# Patient Record
Sex: Male | Born: 1990 | Race: Black or African American | Hispanic: No | Marital: Single | Smoking: Never smoker
Health system: Southern US, Community
[De-identification: ages and names within clinical notes are randomized; demographics above are authoritative.]

## PROBLEM LIST (undated history)

## (undated) HISTORY — PX: HERNIA REPAIR: SHX51

## (undated) HISTORY — PX: ANKLE CLOSED REDUCTION: SHX880

---
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
## 2010-01-31 ENCOUNTER — Emergency Department (HOSPITAL_COMMUNITY): Admission: EM | Admit: 2010-01-31 | Discharge: 2010-01-31 | Payer: Self-pay | Admitting: Family Medicine

## 2010-09-15 ENCOUNTER — Encounter: Admission: RE | Admit: 2010-09-15 | Discharge: 2010-09-15 | Payer: Self-pay | Admitting: Orthopedic Surgery

## 2010-10-18 ENCOUNTER — Ambulatory Visit
Admission: RE | Admit: 2010-10-18 | Discharge: 2010-10-18 | Payer: Self-pay | Source: Home / Self Care | Attending: General Surgery | Admitting: General Surgery

## 2010-11-05 ENCOUNTER — Encounter: Payer: Self-pay | Admitting: Orthopedic Surgery

## 2010-12-26 LAB — POCT I-STAT 4, (NA,K, GLUC, HGB,HCT)
Hemoglobin: 15.6 g/dL (ref 13.0–17.0)
Sodium: 140 mEq/L (ref 135–145)

## 2011-09-03 ENCOUNTER — Emergency Department (HOSPITAL_COMMUNITY)
Admission: EM | Admit: 2011-09-03 | Discharge: 2011-09-03 | Disposition: A | Discharge status: Home / Self Care | Payer: Self-pay | Attending: Emergency Medicine | Admitting: Emergency Medicine

## 2011-09-03 ENCOUNTER — Encounter: Payer: Self-pay | Admitting: *Deleted

## 2011-09-03 ENCOUNTER — Emergency Department (HOSPITAL_COMMUNITY): Payer: Self-pay

## 2011-09-03 DIAGNOSIS — S0003XA Contusion of scalp, initial encounter: Secondary | ICD-10-CM | POA: Insufficient documentation

## 2011-09-03 DIAGNOSIS — R221 Localized swelling, mass and lump, neck: Secondary | ICD-10-CM | POA: Insufficient documentation

## 2011-09-03 DIAGNOSIS — R220 Localized swelling, mass and lump, head: Secondary | ICD-10-CM | POA: Insufficient documentation

## 2011-09-03 DIAGNOSIS — S0083XA Contusion of other part of head, initial encounter: Secondary | ICD-10-CM

## 2011-09-03 DIAGNOSIS — H5789 Other specified disorders of eye and adnexa: Secondary | ICD-10-CM | POA: Insufficient documentation

## 2011-09-03 MED ORDER — DIAZEPAM 5 MG PO TABS: 5.0000 mg | ORAL_TABLET | Freq: Four times a day (QID) | ORAL | Status: AC | PRN

## 2011-09-03 MED ORDER — IBUPROFEN 800 MG PO TABS
800.0000 mg | ORAL_TABLET | Freq: Once | ORAL | Status: AC
  Administered 2011-09-03: 800 mg via ORAL
  Filled 2011-09-03: qty 1

## 2011-09-03 MED ORDER — OXYCODONE-ACETAMINOPHEN 5-325 MG PO TABS
2.0000 | ORAL_TABLET | Freq: Once | ORAL | Status: AC
  Administered 2011-09-03: 2 via ORAL
  Filled 2011-09-03: qty 2

## 2011-09-03 MED ORDER — DIAZEPAM 5 MG PO TABS
5.0000 mg | ORAL_TABLET | Freq: Once | ORAL | Status: AC
  Administered 2011-09-03: 5 mg via ORAL
  Filled 2011-09-03: qty 1

## 2011-09-03 MED ORDER — HYDROCODONE-ACETAMINOPHEN 5-325 MG PO TABS: ORAL_TABLET | ORAL | Status: AC

## 2011-09-03 MED ORDER — IBUPROFEN 800 MG PO TABS: 800.0000 mg | ORAL_TABLET | Freq: Three times a day (TID) | ORAL | Status: AC | PRN

## 2011-09-03 MED ORDER — BACITRACIN ZINC 500 UNIT/GM EX OINT
TOPICAL_OINTMENT | CUTANEOUS | Status: AC
  Administered 2011-09-03: 08:00:00 via TOPICAL
  Filled 2011-09-03: qty 0.9
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
## 2011-09-03 NOTE — ED Provider Notes (Signed)
Medical screening examination/treatment/procedure(s) were performed by non-physician practitioner and as supervising physician I was immediately available for consultation/collaboration.  Jasmine Awe, MD 09/03/11 [phone]
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
## 2012-06-14 ENCOUNTER — Other Ambulatory Visit: Payer: Self-pay | Admitting: Orthopedic Surgery

## 2012-06-14 DIAGNOSIS — S76319A Strain of muscle, fascia and tendon of the posterior muscle group at thigh level, unspecified thigh, initial encounter: Secondary | ICD-10-CM

## 2012-06-15 ENCOUNTER — Ambulatory Visit
Admission: RE | Admit: 2012-06-15 | Discharge: 2012-06-15 | Disposition: A | Discharge status: Home / Self Care | Payer: BC Managed Care – PPO | Source: Ambulatory Visit | Attending: Orthopedic Surgery | Admitting: Orthopedic Surgery

## 2012-06-15 DIAGNOSIS — S76319A Strain of muscle, fascia and tendon of the posterior muscle group at thigh level, unspecified thigh, initial encounter: Secondary | ICD-10-CM

## 2014-07-16 ENCOUNTER — Encounter (HOSPITAL_COMMUNITY): Payer: Self-pay | Admitting: Emergency Medicine

## 2014-07-16 ENCOUNTER — Emergency Department (HOSPITAL_COMMUNITY)
Admission: EM | Admit: 2014-07-16 | Discharge: 2014-07-16 | Disposition: A | Discharge status: Home / Self Care | Payer: BC Managed Care – PPO | Attending: Emergency Medicine | Admitting: Emergency Medicine

## 2014-07-16 DIAGNOSIS — S3992XA Unspecified injury of lower back, initial encounter: Secondary | ICD-10-CM | POA: Diagnosis not present

## 2014-07-16 DIAGNOSIS — S199XXA Unspecified injury of neck, initial encounter: Secondary | ICD-10-CM | POA: Diagnosis present

## 2014-07-16 DIAGNOSIS — Z791 Long term (current) use of non-steroidal anti-inflammatories (NSAID): Secondary | ICD-10-CM | POA: Diagnosis not present

## 2014-07-16 DIAGNOSIS — S6992XA Unspecified injury of left wrist, hand and finger(s), initial encounter: Secondary | ICD-10-CM | POA: Insufficient documentation

## 2014-07-16 DIAGNOSIS — Y92410 Unspecified street and highway as the place of occurrence of the external cause: Secondary | ICD-10-CM | POA: Diagnosis not present

## 2014-07-16 DIAGNOSIS — M7918 Myalgia, other site: Secondary | ICD-10-CM

## 2014-07-16 DIAGNOSIS — Z79899 Other long term (current) drug therapy: Secondary | ICD-10-CM | POA: Insufficient documentation

## 2014-07-16 DIAGNOSIS — Y9389 Activity, other specified: Secondary | ICD-10-CM | POA: Diagnosis not present

## 2014-07-16 MED ORDER — CYCLOBENZAPRINE HCL 10 MG PO TABS: 10.0000 mg | ORAL_TABLET | Freq: Two times a day (BID) | ORAL | Status: AC | PRN

## 2014-07-16 MED ORDER — IBUPROFEN 800 MG PO TABS: 800.0000 mg | ORAL_TABLET | Freq: Three times a day (TID) | ORAL | Status: DC
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
## 2014-07-16 NOTE — ED Provider Notes (Signed)
Medical screening examination/treatment/procedure(s) were performed by non-physician practitioner and as supervising physician I was immediately available for consultation/collaboration.  Demetrie Borge, MD 07/16/14 1528 
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
## 2015-09-28 ENCOUNTER — Emergency Department (HOSPITAL_COMMUNITY): Payer: BLUE CROSS/BLUE SHIELD

## 2015-09-28 ENCOUNTER — Encounter (HOSPITAL_COMMUNITY): Payer: Self-pay | Admitting: Emergency Medicine

## 2015-09-28 ENCOUNTER — Emergency Department (HOSPITAL_COMMUNITY)
Admission: EM | Admit: 2015-09-28 | Discharge: 2015-09-29 | Disposition: A | Discharge status: Home / Self Care | Payer: BLUE CROSS/BLUE SHIELD | Attending: Emergency Medicine | Admitting: Emergency Medicine

## 2015-09-28 DIAGNOSIS — Y998 Other external cause status: Secondary | ICD-10-CM | POA: Insufficient documentation

## 2015-09-28 DIAGNOSIS — S99911A Unspecified injury of right ankle, initial encounter: Secondary | ICD-10-CM | POA: Diagnosis present

## 2015-09-28 DIAGNOSIS — W500XXA Accidental hit or strike by another person, initial encounter: Secondary | ICD-10-CM | POA: Insufficient documentation

## 2015-09-28 DIAGNOSIS — M25571 Pain in right ankle and joints of right foot: Secondary | ICD-10-CM

## 2015-09-28 DIAGNOSIS — Y9289 Other specified places as the place of occurrence of the external cause: Secondary | ICD-10-CM | POA: Diagnosis not present

## 2015-09-28 DIAGNOSIS — Z9889 Other specified postprocedural states: Secondary | ICD-10-CM | POA: Diagnosis not present

## 2015-09-28 DIAGNOSIS — Y9361 Activity, american tackle football: Secondary | ICD-10-CM | POA: Diagnosis not present

## 2015-09-28 MED ORDER — IBUPROFEN 800 MG PO TABS
800.0000 mg | ORAL_TABLET | Freq: Once | ORAL | Status: AC
  Administered 2015-09-28: 800 mg via ORAL
  Filled 2015-09-28: qty 1

## 2015-09-28 NOTE — ED Provider Notes (Signed)
CSN: [id number]     Arrival dat Patient is a is no ankle brace for stability. He has crutches already fore & time 09/28/15  2305 History  By signing my name below, I, Emmanuella Mensah, attest that this documentation has been prepared under the direction and in the presence of [employer], PA-C. Electronically Signed: Angelene Giovanni, ED Scribe. 09/28/2015. 11:32 PM.     Chief Complaint  Patient presents with  . Ankle Injury   The history is provided by the patient. No language interpreter was used.   HPI Comments: Willie Eaton is a 24 y.o. male who presents to the Emergency Department complaining of gradually worsening 8/10 right ankle pain s/p ankle injury that occurred today. He reports associated mild swelling to the left lateral aspect of his right ankle. He denies any fever. He explains that he was playing football when someone fell on his right ankle. No alleviating factors noted. He reports that he had a trimalleolar fracture injury in college and needed to have surgery.   No past medical history on file. Past Surgical History  Procedure Laterality Date  . Hernia repair    . Ankle closed reduction     No family history on file. Social History  Substance Use Topics  . Smoking status: Never Smoker   . Smokeless tobacco: None  . Alcohol Use: Yes     Comment: Q 2 weeks    Review of Systems  Constitutional: Negative for fever.  Musculoskeletal: Positive for joint swelling (mild) and arthralgias.  All other systems reviewed and are negative.   Allergies  Review of patient's allergies indicates no known allergies.  Home Medications   Prior to Admission medications   Medication Sig Start Date End Date Taking? Authorizing Provider  cyclobenzaprine (FLEXERIL) 10 MG tablet Take 1 tablet (10 mg total) by mouth 2 (two) times daily as needed for muscle spasms. Patient not taking: Reported on 09/28/2015 07/16/14   Kathrynn Speed, PA-C  ibuprofen (ADVIL,MOTRIN) 800 MG tablet  Take 1 tablet (800 mg total) by mouth 3 (three) times daily. 09/29/15   Antony Madura, PA-C   BP 142/61 mmHg  Pulse 61  Temp(Src) 98.4 F (36.9 C) (Oral)  Resp 20  SpO2 100%   Physical Exam  Constitutional: He is oriented to person, place, and time. He appears well-developed and well-nourished. No distress.  Nontoxic/nonseptic appearing  HENT:  Head: Normocephalic and atraumatic.  Eyes: Conjunctivae and EOM are normal. No scleral icterus.  Neck: Normal range of motion.  Cardiovascular: Normal rate, regular rhythm and intact distal pulses.   DP and PT pulses 2+ in the RLE  Pulmonary/Chest: Effort normal. No respiratory distress.  Respirations even and unlabored  Musculoskeletal: Normal range of motion.       Right ankle: He exhibits swelling (mild, medially). He exhibits normal range of motion, no deformity and normal pulse. Tenderness. Lateral malleolus and medial malleolus tenderness found. Achilles tendon normal.       Right lower leg: Normal.       Right foot: Normal.       Feet:  Neurological: He is alert and oriented to person, place, and time. He exhibits normal muscle tone. Coordination normal.  Sensation to light touch intact. Patient able to wiggle all toes.  Skin: Skin is warm and dry. No rash noted. He is not diaphoretic. No erythema. No pallor.  Psychiatric: He has a normal mood and affect. His behavior is normal.  Nursing note and vitals reviewed.  ED Course  Procedures (including critical care time) DIAGNOSTIC STUDIES: Oxygen Saturation is 100% on RA, normal by my interpretation.    COORDINATION OF CARE: 11:31 PM- Pt advised of plan for treatment and pt agrees. Pt will receive x-ray. Pt will also receive an ice pack and pain medication.   Imaging Review Dg Ankle Complete Right  09/29/2015  CLINICAL DATA:  Acute onset of medial right ankle pain, after basketball injury. Initial encounter. EXAM: RIGHT ANKLE - COMPLETE 3+ VIEW COMPARISON:  Right ankle MRI  performed 09/15/2010 FINDINGS: There is no evidence of fracture or dislocation. The ankle mortise is intact; the interosseous space is within normal limits. No talar tilt or subluxation is seen. The joint spaces are preserved.  An ankle joint effusion is noted. IMPRESSION: 1. No evidence of fracture or dislocation. 2. Ankle joint effusion noted. Electronically Signed   By: Roanna RaiderJeffery  Chang M.D.   On: 09/29/2015 00:13     Antony MaduraKelly Lavell Ridings, PA-C has personally reviewed and evaluated these images as part of her medical decision-making.  MDM   Final diagnoses:  Right ankle pain    Patient with ankle pain onset tonight. No bony deformity or crepitus. Xray negative for acute fracture. Patient neurovascularly intact. ASO ankle brace given for stability. He has crutches already for WBAT. Will advise on RICE and give NSAIDs for pain. Patient referred to his orthopedist for f/u if symptoms persist or worsen.  I personally performed the services described in this documentation, which was scribed in my presence. The recorded information has been reviewed and is accurate.    Filed Vitals:   09/28/15 2318  BP: 142/61  Pulse: 61  Temp: 98.4 F (36.9 C)  TempSrc: Oral  Resp: 20  SpO2: 100%     Antony MaduraKelly Caiden Monsivais, PA-C 09/29/15 0021  April Palumbo, MD 09/29/15 [phone]

## 2015-09-28 NOTE — ED Notes (Signed)
PA at bedside.

## 2015-09-28 NOTE — ED Notes (Signed)
Pt was at football practice when injury occurred to the right ankle. Pt has air cast and crutches in place from prev injury.

## 2015-09-29 MED ORDER — IBUPROFEN 800 MG PO TABS: 800.0000 mg | ORAL_TABLET | Freq: Three times a day (TID) | ORAL | Status: AC

## 2015-09-29 NOTE — Discharge Instructions (Signed)
